# Patient Record
Sex: Male | Born: 1985 | Hispanic: Yes
Health system: Southern US, Community
[De-identification: ages and names within clinical notes are randomized; demographics above are authoritative.]

---

## 2019-11-14 ENCOUNTER — Other Ambulatory Visit: Payer: Self-pay

## 2019-11-14 ENCOUNTER — Emergency Department (HOSPITAL_COMMUNITY): Payer: No Typology Code available for payment source

## 2019-11-14 ENCOUNTER — Emergency Department (HOSPITAL_COMMUNITY)
Admission: EM | Admit: 2019-11-14 | Discharge: 2019-11-14 | Disposition: A | Discharge status: Home / Self Care | Payer: No Typology Code available for payment source | Attending: Emergency Medicine | Admitting: Emergency Medicine

## 2019-11-14 DIAGNOSIS — Y93I9 Activity, other involving external motion: Secondary | ICD-10-CM | POA: Insufficient documentation

## 2019-11-14 DIAGNOSIS — R519 Headache, unspecified: Secondary | ICD-10-CM | POA: Insufficient documentation

## 2019-11-14 DIAGNOSIS — Y92414 Local residential or business street as the place of occurrence of the external cause: Secondary | ICD-10-CM | POA: Insufficient documentation

## 2019-11-14 DIAGNOSIS — M542 Cervicalgia: Secondary | ICD-10-CM | POA: Insufficient documentation

## 2019-11-14 DIAGNOSIS — T1490XA Injury, unspecified, initial encounter: Secondary | ICD-10-CM

## 2019-11-14 DIAGNOSIS — Y999 Unspecified external cause status: Secondary | ICD-10-CM | POA: Diagnosis not present

## 2019-11-14 DIAGNOSIS — R1031 Right lower quadrant pain: Secondary | ICD-10-CM | POA: Insufficient documentation

## 2019-11-14 LAB — I-STAT CHEM 8, ED
BUN: 14 mg/dL (ref 6–20)
Calcium, Ion: 1.2 mmol/L (ref 1.15–1.40)
Chloride: 102 mmol/L (ref 98–111)
Creatinine, Ser: 0.6 mg/dL — ABNORMAL LOW (ref 0.61–1.24)
Glucose, Bld: 106 mg/dL — ABNORMAL HIGH (ref 70–99)
HCT: 36 % — ABNORMAL LOW (ref 39.0–52.0)
Hemoglobin: 12.2 g/dL — ABNORMAL LOW (ref 13.0–17.0)
Potassium: 3.9 mmol/L (ref 3.5–5.1)
Sodium: 136 mmol/L (ref 135–145)
TCO2: 28 mmol/L (ref 22–32)

## 2019-11-14 MED ORDER — IOHEXOL 300 MG/ML  SOLN
100.0000 mL | Freq: Once | INTRAMUSCULAR | Status: AC | PRN
  Administered 2019-11-14: 100 mL via INTRAVENOUS

## 2019-11-14 MED ORDER — CYCLOBENZAPRINE HCL 10 MG PO TABS
10.0000 mg | ORAL_TABLET | Freq: Two times a day (BID) | ORAL | 0 refills | Status: AC | PRN
Start: 2019-11-14 — End: ?

## 2019-11-14 MED ORDER — ACETAMINOPHEN 325 MG PO TABS: 650.0000 mg | ORAL_TABLET | Freq: Once | ORAL | Status: DC

## 2019-11-14 MED ORDER — ONDANSETRON HCL 4 MG/2ML IJ SOLN
4.0000 mg | Freq: Once | INTRAMUSCULAR | Status: AC
  Administered 2019-11-14: 4 mg via INTRAVENOUS
  Filled 2019-11-14: qty 2

## 2019-11-14 MED ORDER — MORPHINE SULFATE (PF) 4 MG/ML IV SOLN
4.0000 mg | Freq: Once | INTRAVENOUS | Status: AC
  Administered 2019-11-14: 4 mg via INTRAVENOUS
  Filled 2019-11-14: qty 1

## 2019-11-14 MED ORDER — IBUPROFEN 600 MG PO TABS
600.0000 mg | ORAL_TABLET | Freq: Four times a day (QID) | ORAL | 0 refills | Status: AC | PRN
Start: 2019-11-14 — End: ?
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
## 2019-11-14 NOTE — ED Notes (Signed)
Patient given discharge instructions patient verbalizes understanding. 
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
